# Patient Record
Sex: Female | Born: 2005 | Race: White | Hispanic: No | Marital: Single | Smoking: Never smoker
Health system: Southern US, Community
[De-identification: ages and names within clinical notes are randomized; demographics above are authoritative.]

## PROBLEM LIST (undated history)

## (undated) DIAGNOSIS — R1115 Cyclical vomiting syndrome unrelated to migraine: Secondary | ICD-10-CM

## (undated) DIAGNOSIS — T7840XA Allergy, unspecified, initial encounter: Secondary | ICD-10-CM

## (undated) DIAGNOSIS — F419 Anxiety disorder, unspecified: Secondary | ICD-10-CM

## (undated) DIAGNOSIS — G90A Postural orthostatic tachycardia syndrome (POTS): Secondary | ICD-10-CM

## (undated) DIAGNOSIS — K529 Noninfective gastroenteritis and colitis, unspecified: Secondary | ICD-10-CM

## (undated) DIAGNOSIS — F32A Depression, unspecified: Secondary | ICD-10-CM

## (undated) DIAGNOSIS — J45909 Unspecified asthma, uncomplicated: Secondary | ICD-10-CM

## (undated) DIAGNOSIS — Z98811 Dental restoration status: Secondary | ICD-10-CM

## (undated) HISTORY — PX: DENTAL REHABILITATION: SHX1449

---
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
## 2024-02-08 IMAGING — US US PELVIS COMPLETE
1 series · 14 of 25 positions shown · non-contrast
Comparison: None Available.

CLINICAL DATA: Left lower quadrant pelvic pain

EXAM:
TRANSABDOMINAL ULTRASOUND OF PELVIS
DOPPLER ULTRASOUND OF OVARIES
TECHNIQUE: Transabdominal ultrasound examination of the pelvis was performed
including evaluation of the uterus, ovaries, adnexal regions, and
pelvic cul-de-sac.
Color and duplex Doppler ultrasound was utilized to evaluate blood
flow to the ovaries.

[Series 1: us pelvis (transabdominal only) · 14 of 83 slices shown]
[im 1/83]
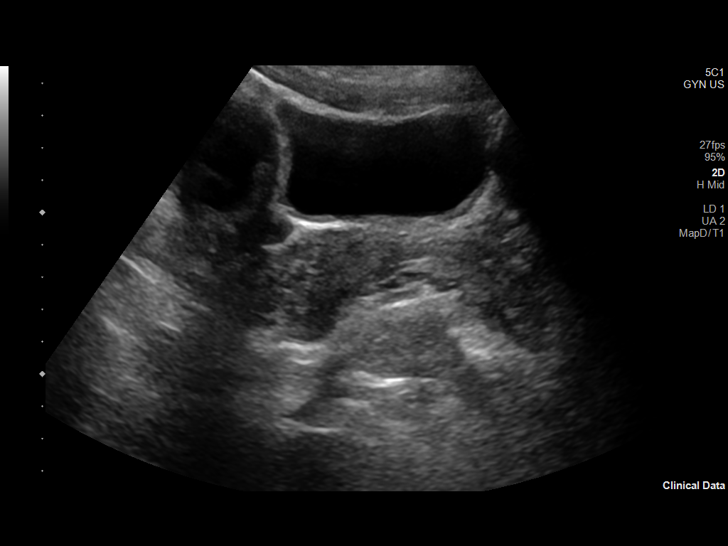
[im 7/83]
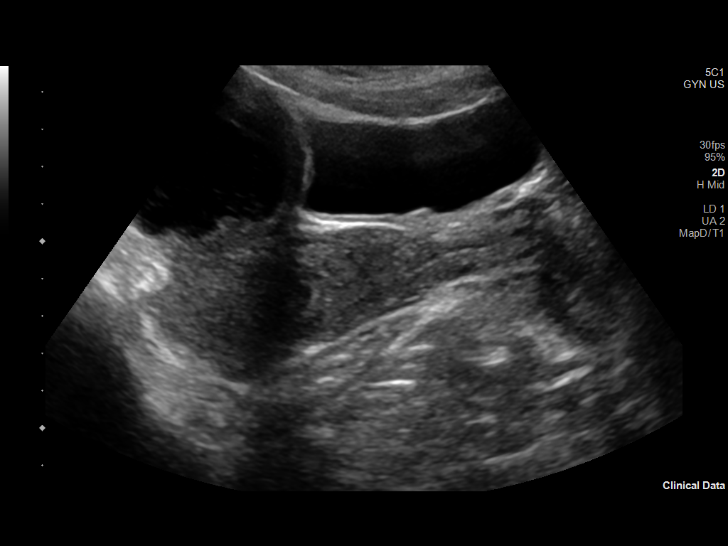
[im 14/83]
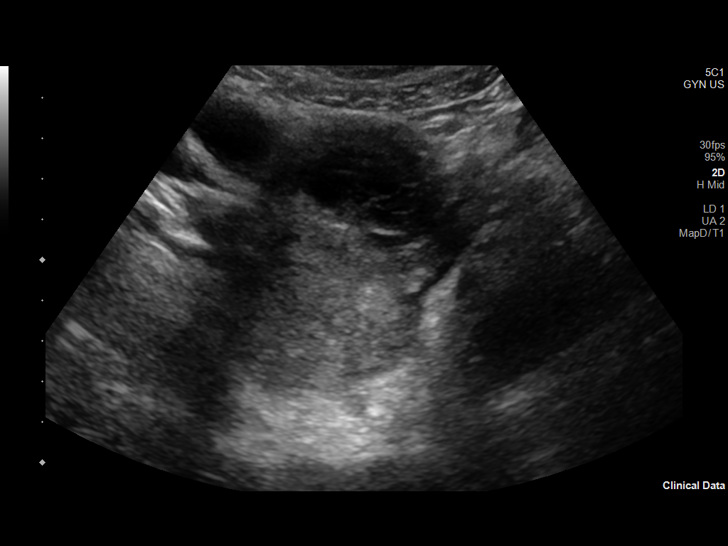
[im 21/83]
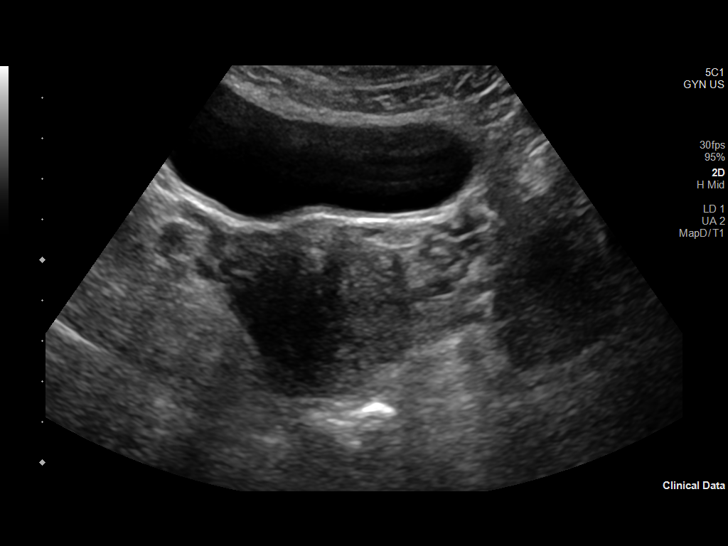
[im 28/83]
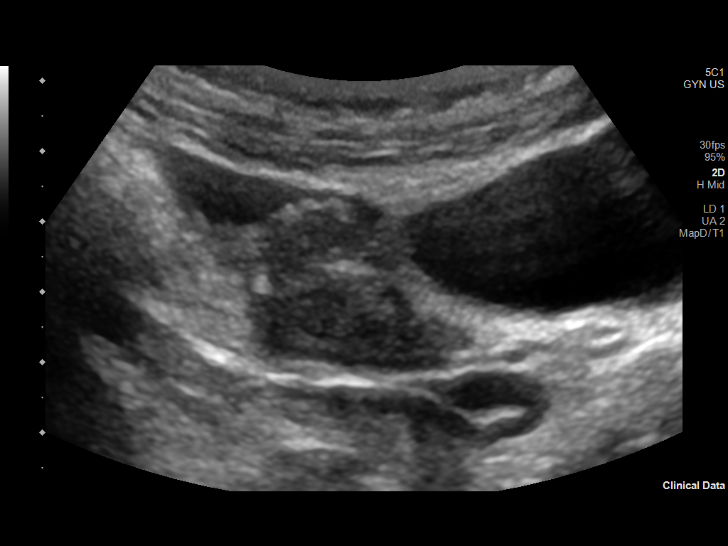
[im 31/83]
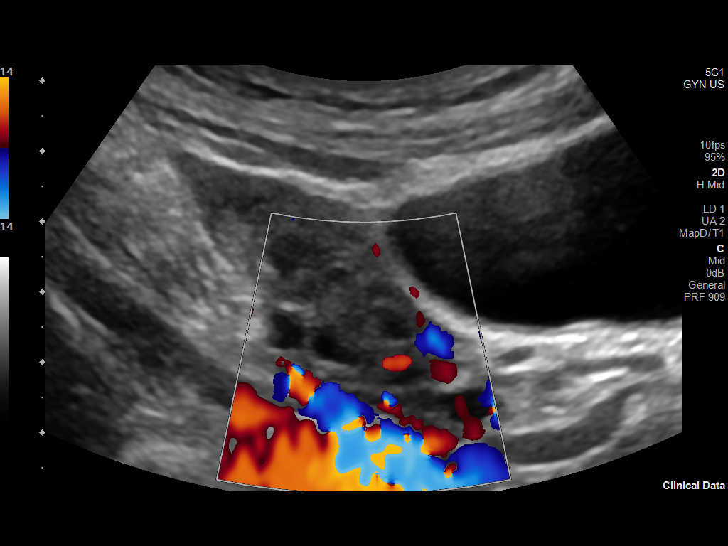
[im 38/83]
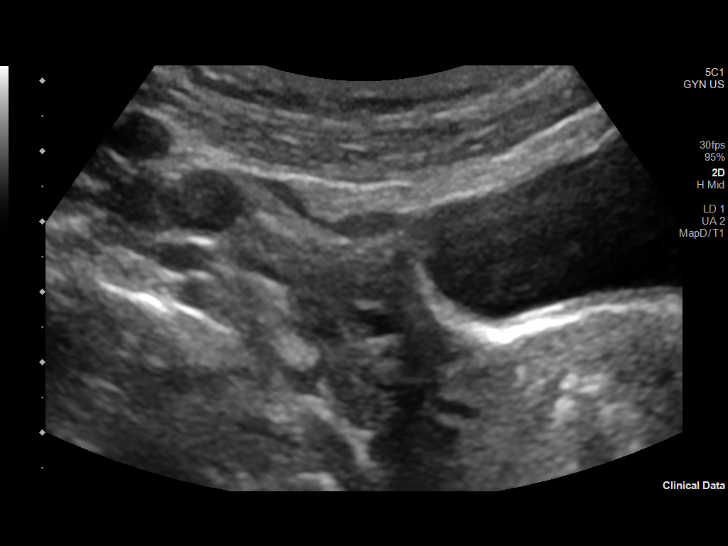
[im 45/83]
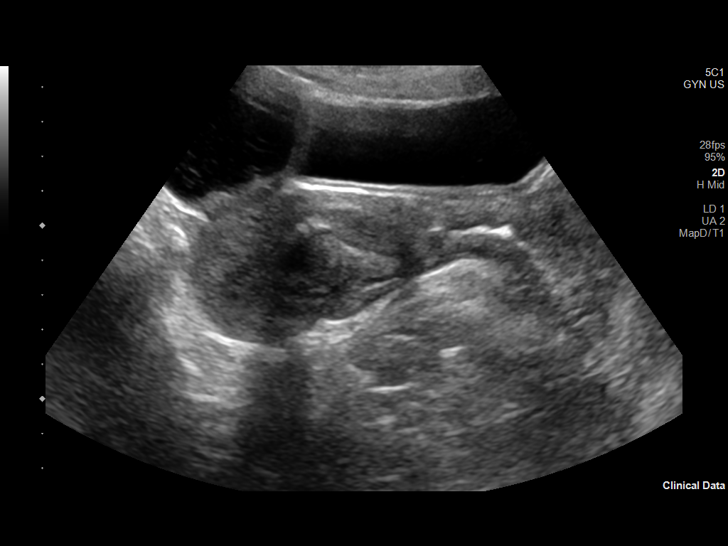
[im 52/83]
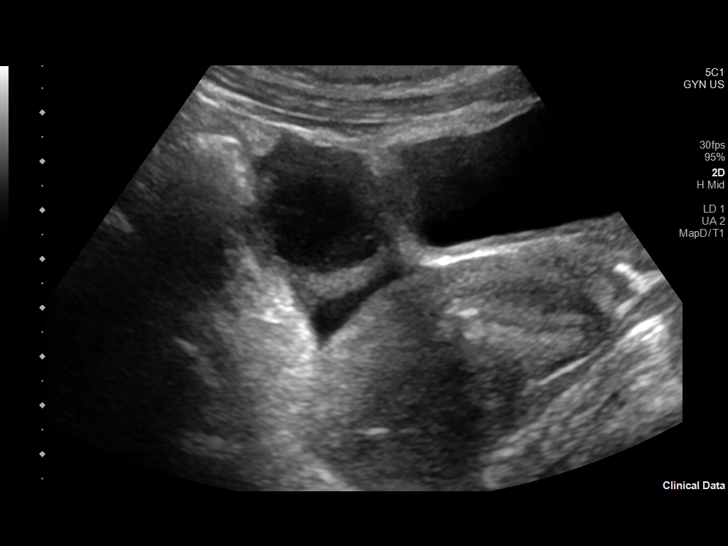
[im 55/83]
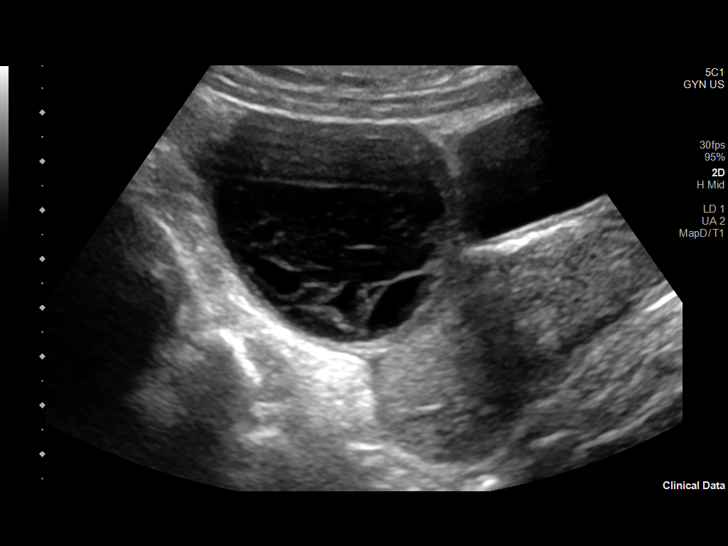
[im 62/83]
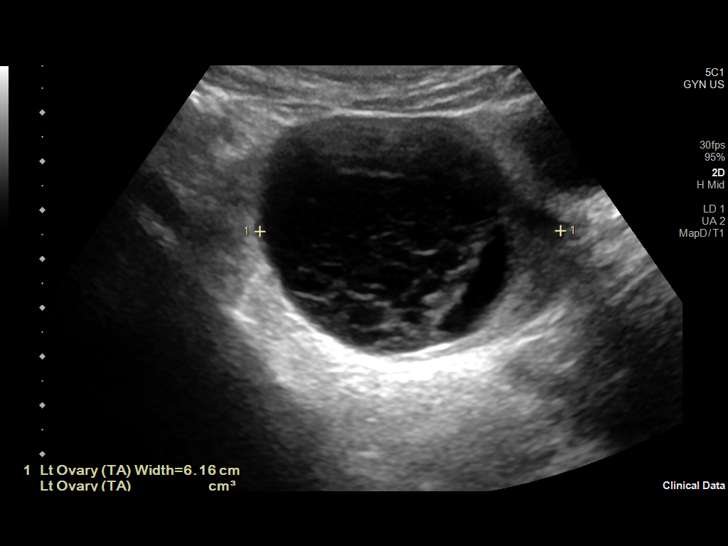
[im 69/83]
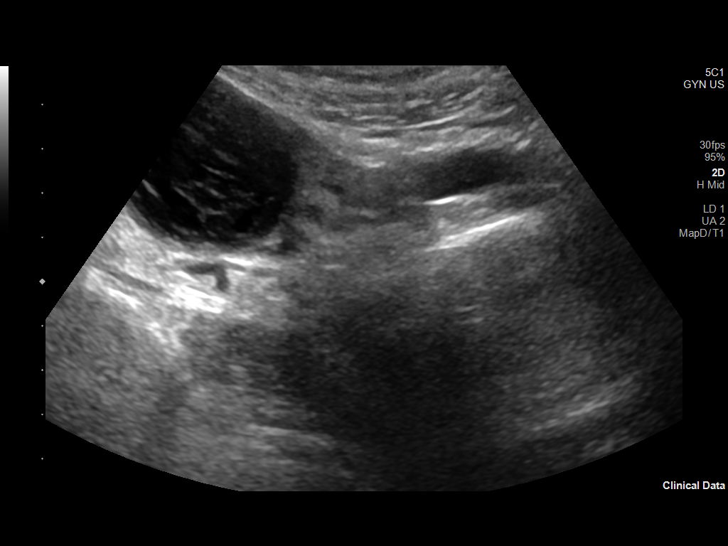
[im 76/83]
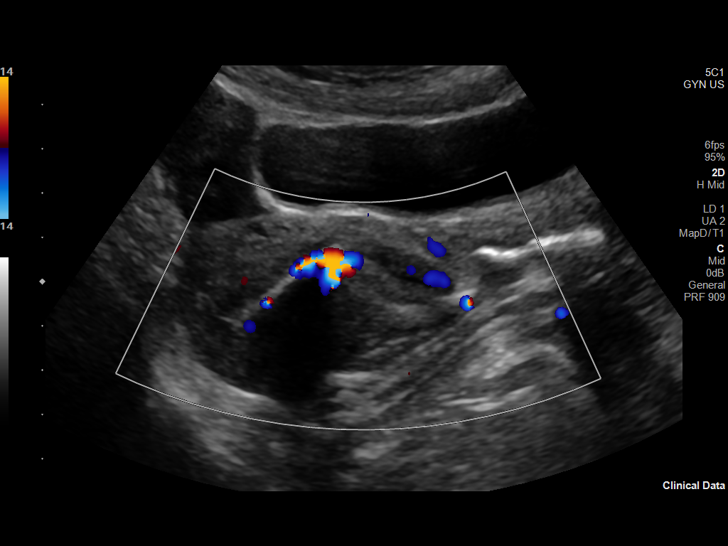
[im 83/83]
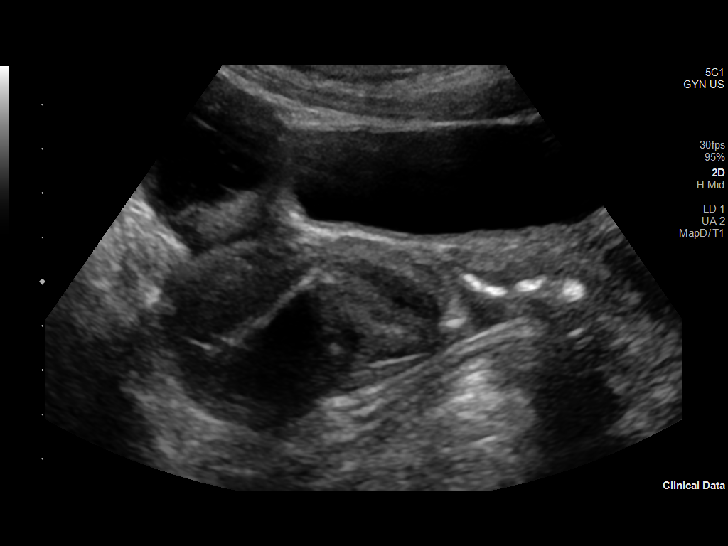

[14 of 25 positions shown; findings below may reference images not displayed]

FINDINGS: Uterus

Measurements: 7.3 x 4.5 x 4.9 cm = volume: 84 mL. No fibroids or
other mass visualized.

Endometrium

Thickness: 7 mm.  IUD in uterine fundus.

Right ovary

Measurements: 2.8 x 1.4 x 2.3 cm = volume: 4.7 mL. Normal
appearance/no adnexal mass.

Left ovary

Measurements: 5.7 x 4.9 x 6.2 cm = volume: 88.7 mL. 5.0 x 4.3 x
cm hemorrhagic cyst, benign. No follow-up is recommended.

Pulsed Doppler evaluation demonstrates normal low-resistance
arterial and venous waveforms in both ovaries.

Other: Small volume pelvic ascites, physiologic.
IMPRESSION: 5.1 cm hemorrhagic left ovarian cyst.  No follow-up is recommended.

No evidence of ovarian torsion.

IUD in the uterine fundus.

## 2024-02-08 IMAGING — CT CT HEAD W/O CM
3 of 4 series · 13 of 47 positions shown, 15 images · non-contrast
Comparison: None Available.

CLINICAL DATA: Worsening chronic headache.



[Series 2: head wo · axial · 0.33mm/px · z∈[-715,-582]mm · 7 of 40 slices shown, 9 images]
[im 5/40  brain]
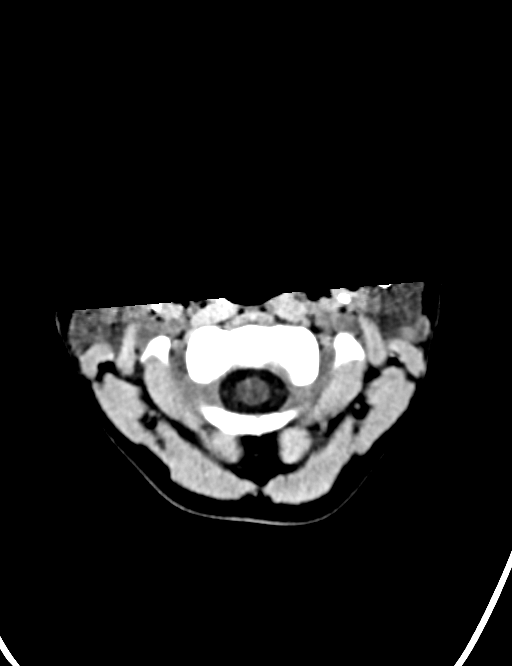
[im 5/40  bone]
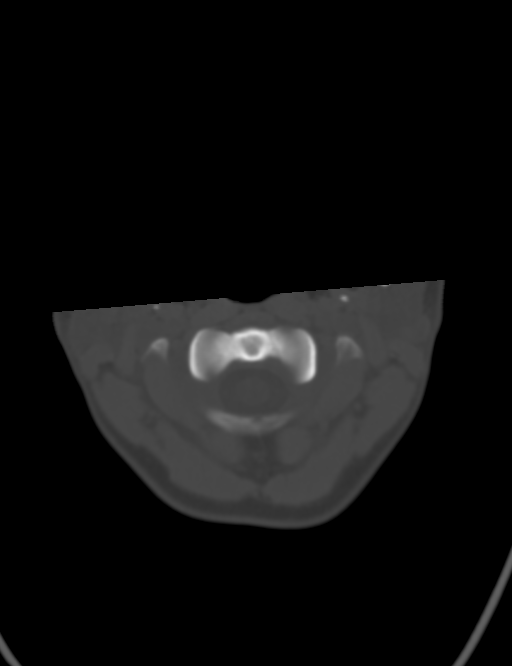
[im 10/40  brain]
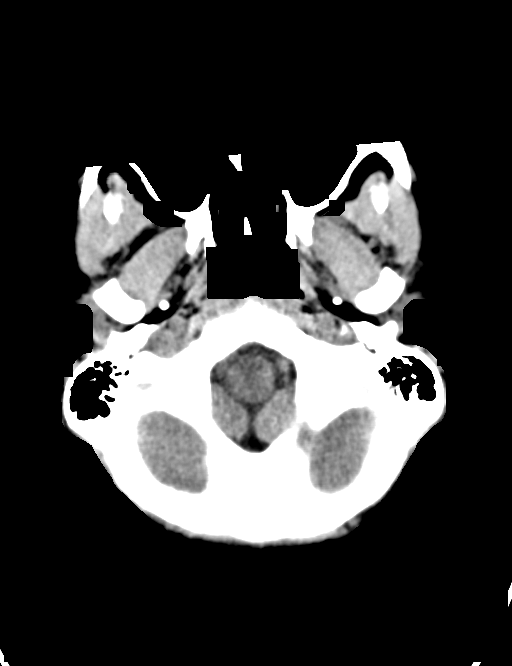
[im 15/40  brain]
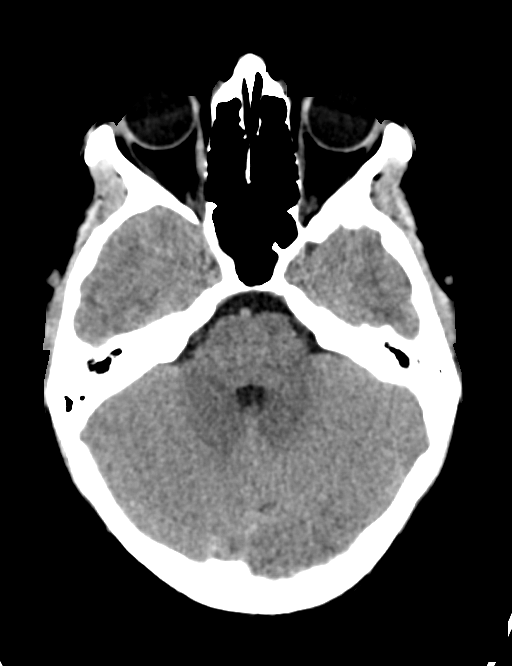
[im 20/40  brain]
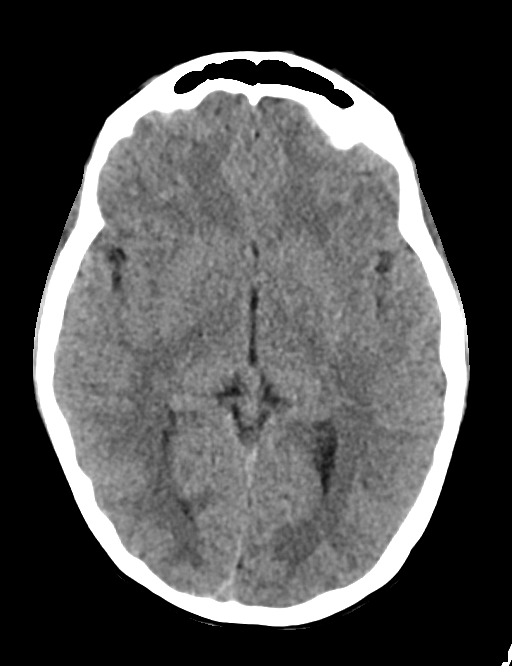
[im 25/40  brain]
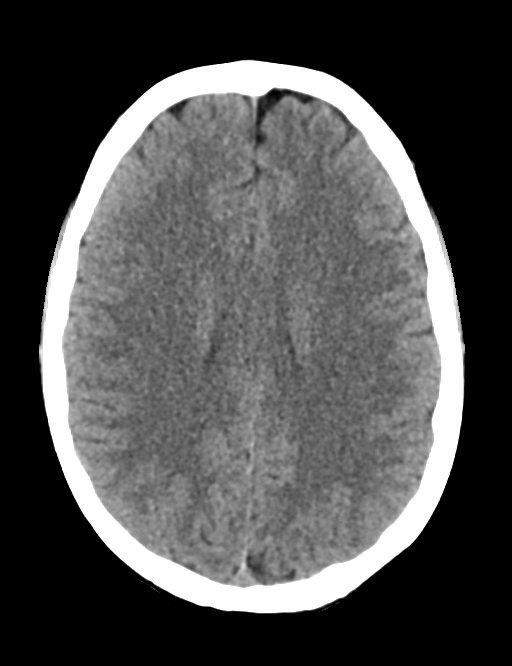
[im 25/40  bone]
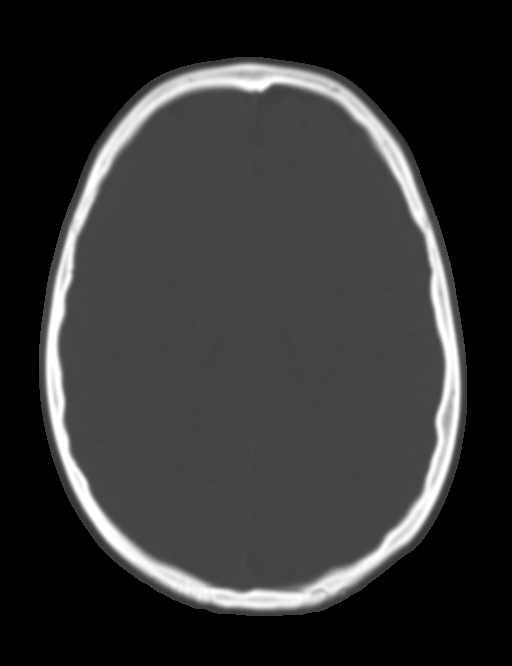
[im 30/40  brain]
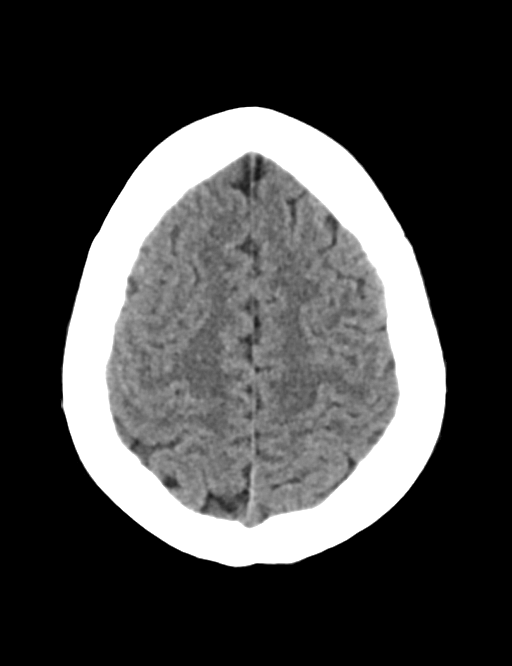
[im 35/40  brain]
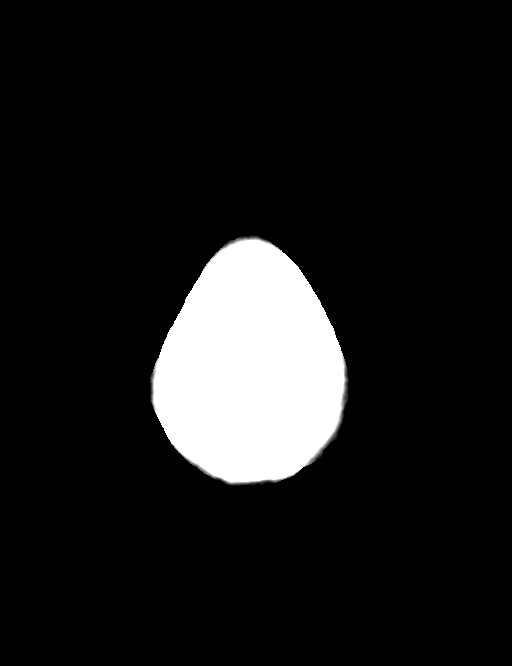

[Series 4: cor soft · coronal · 0.33mm/px · 3 of 66 slices shown]
[im 25/66  brain]
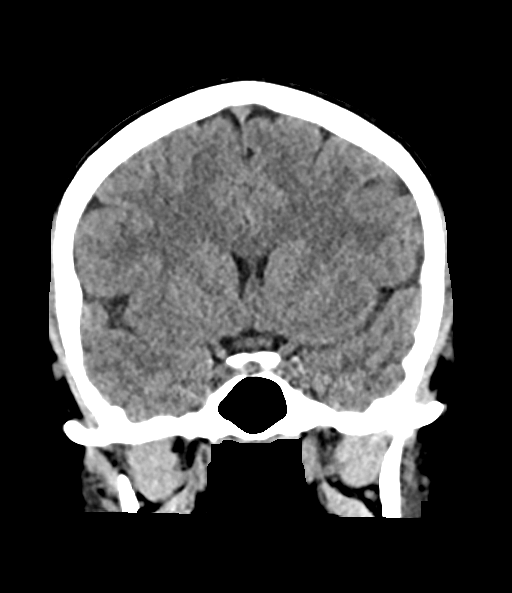
[im 30/66  brain]
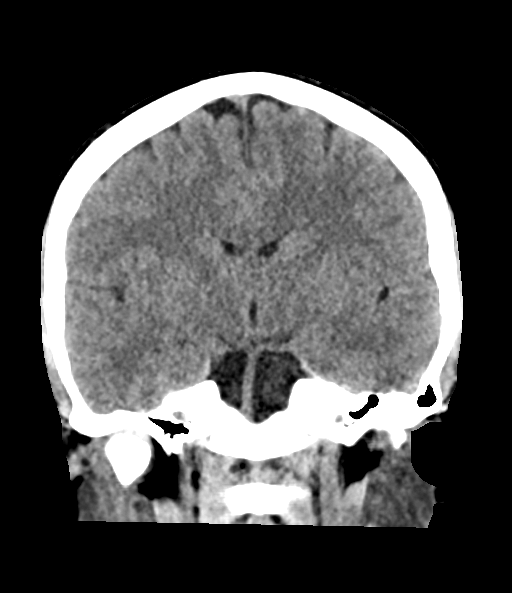
[im 36/66  brain]
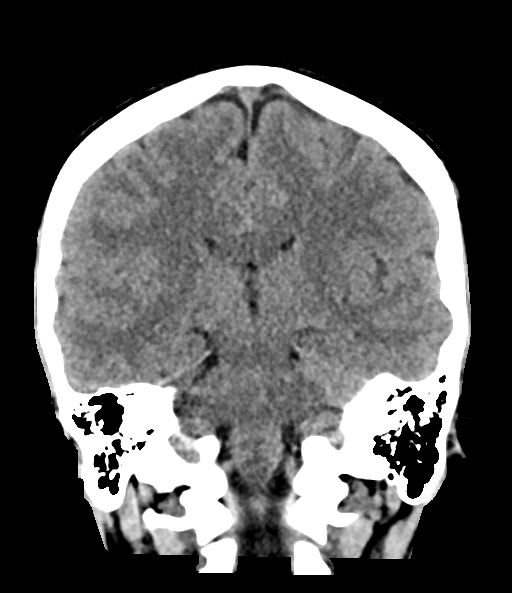

[Series 5: sag soft · sagittal · 0.36mm/px · 3 of 55 slices shown]
[im 19/55  brain]
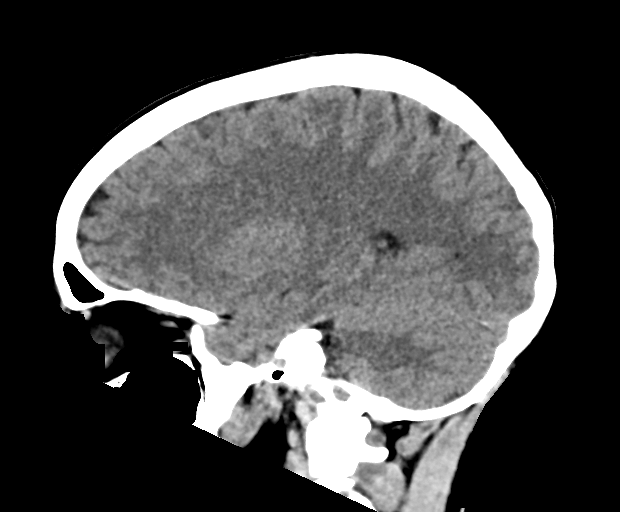
[im 28/55  brain]
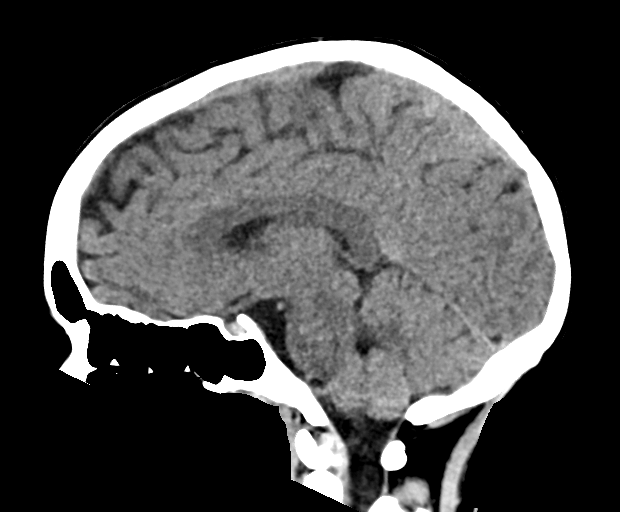
[im 37/55  brain]
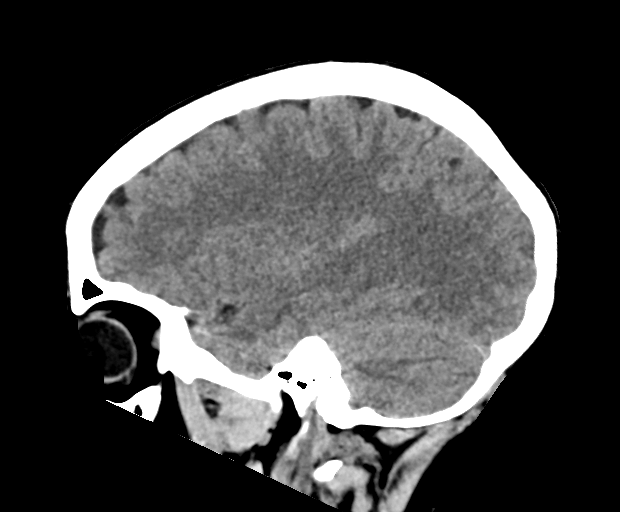

[13 of 47 positions shown; findings below may reference images not displayed]

FINDINGS: Brain: No evidence of acute infarction, hemorrhage, hydrocephalus,
extra-axial collection or mass lesion/mass effect.

Vascular: No hyperdense vessel or unexpected calcification.

Skull: Normal. Negative for fracture or focal lesion.

Sinuses/Orbits: No acute finding.

Other: None.
IMPRESSION: No acute intracranial pathology.

## 2024-04-24 ENCOUNTER — Encounter (INDEPENDENT_AMBULATORY_CARE_PROVIDER_SITE_OTHER): Payer: Self-pay

## 2024-04-25 ENCOUNTER — Encounter (INDEPENDENT_AMBULATORY_CARE_PROVIDER_SITE_OTHER): Payer: Self-pay
